# Patient Record
Sex: Male | Born: 1995 | Race: Black or African American | Hispanic: No | Marital: Single | Smoking: Current some day smoker
Health system: Southern US, Community
[De-identification: ages and names within clinical notes are randomized; demographics above are authoritative.]

## PROBLEM LIST (undated history)

## (undated) DIAGNOSIS — F39 Unspecified mood [affective] disorder: Secondary | ICD-10-CM

## (undated) DIAGNOSIS — F909 Attention-deficit hyperactivity disorder, unspecified type: Secondary | ICD-10-CM

---
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
## 2018-09-16 ENCOUNTER — Other Ambulatory Visit: Payer: Self-pay

## 2018-09-16 ENCOUNTER — Encounter (HOSPITAL_COMMUNITY): Payer: Self-pay | Admitting: Emergency Medicine

## 2018-09-16 ENCOUNTER — Emergency Department (HOSPITAL_COMMUNITY): Payer: No Typology Code available for payment source

## 2018-09-16 ENCOUNTER — Emergency Department (HOSPITAL_COMMUNITY)
Admission: EM | Admit: 2018-09-16 | Discharge: 2018-09-16 | Disposition: A | Discharge status: Home / Self Care | Payer: No Typology Code available for payment source | Attending: Emergency Medicine | Admitting: Emergency Medicine

## 2018-09-16 DIAGNOSIS — M7918 Myalgia, other site: Secondary | ICD-10-CM

## 2018-09-16 DIAGNOSIS — F17210 Nicotine dependence, cigarettes, uncomplicated: Secondary | ICD-10-CM | POA: Insufficient documentation

## 2018-09-16 DIAGNOSIS — M791 Myalgia, unspecified site: Secondary | ICD-10-CM | POA: Diagnosis not present

## 2018-09-16 DIAGNOSIS — Z23 Encounter for immunization: Secondary | ICD-10-CM | POA: Diagnosis not present

## 2018-09-16 DIAGNOSIS — F909 Attention-deficit hyperactivity disorder, unspecified type: Secondary | ICD-10-CM | POA: Diagnosis not present

## 2018-09-16 DIAGNOSIS — Z79899 Other long term (current) drug therapy: Secondary | ICD-10-CM | POA: Diagnosis not present

## 2018-09-16 DIAGNOSIS — R52 Pain, unspecified: Secondary | ICD-10-CM

## 2018-09-16 DIAGNOSIS — M79602 Pain in left arm: Secondary | ICD-10-CM | POA: Insufficient documentation

## 2018-09-16 MED ORDER — OXYCODONE-ACETAMINOPHEN 5-325 MG PO TABS
2.0000 | ORAL_TABLET | Freq: Once | ORAL | Status: AC
  Administered 2018-09-16: 2 via ORAL
  Filled 2018-09-16: qty 2

## 2018-09-16 MED ORDER — TETANUS-DIPHTH-ACELL PERTUSSIS 5-2.5-18.5 LF-MCG/0.5 IM SUSP
0.5000 mL | Freq: Once | INTRAMUSCULAR | Status: AC
  Administered 2018-09-16: 0.5 mL via INTRAMUSCULAR
  Filled 2018-09-16: qty 0.5

## 2018-09-16 NOTE — ED Notes (Signed)
Patient transported to X-ray 
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
